# Patient Record
Sex: Male | Born: 1980
Health system: Southern US, Community
[De-identification: ages and names within clinical notes are randomized; demographics above are authoritative.]

## PROBLEM LIST (undated history)

## (undated) DIAGNOSIS — F419 Anxiety disorder, unspecified: Secondary | ICD-10-CM

## (undated) DIAGNOSIS — L309 Dermatitis, unspecified: Secondary | ICD-10-CM

## (undated) DIAGNOSIS — J302 Other seasonal allergic rhinitis: Secondary | ICD-10-CM

## (undated) DIAGNOSIS — J45909 Unspecified asthma, uncomplicated: Secondary | ICD-10-CM

## (undated) DIAGNOSIS — J189 Pneumonia, unspecified organism: Secondary | ICD-10-CM

## (undated) HISTORY — PX: WISDOM TOOTH EXTRACTION: SHX21

## (undated) HISTORY — PX: VASECTOMY: SHX75

---

## 2004-03-13 DIAGNOSIS — J189 Pneumonia, unspecified organism: Secondary | ICD-10-CM

## 2004-03-13 HISTORY — DX: Pneumonia, unspecified organism: J18.9

## 2012-03-03 ENCOUNTER — Ambulatory Visit: Payer: Self-pay | Admitting: Emergency Medicine

## 2012-03-03 DIAGNOSIS — Z01810 Encounter for preprocedural cardiovascular examination: Secondary | ICD-10-CM

## 2012-03-03 LAB — RAPID INFLUENZA A&B ANTIGENS

## 2015-11-17 ENCOUNTER — Ambulatory Visit
Admission: EM | Admit: 2015-11-17 | Discharge: 2015-11-17 | Disposition: A | Discharge status: Home / Self Care | Payer: BLUE CROSS/BLUE SHIELD | Attending: Family Medicine | Admitting: Family Medicine

## 2015-11-17 DIAGNOSIS — R0602 Shortness of breath: Secondary | ICD-10-CM | POA: Diagnosis not present

## 2015-11-17 DIAGNOSIS — R0789 Other chest pain: Secondary | ICD-10-CM | POA: Diagnosis not present

## 2015-11-17 HISTORY — DX: Unspecified asthma, uncomplicated: J45.909

## 2015-11-17 MED ORDER — ASPIRIN 81 MG PO CHEW
324.0000 mg | CHEWABLE_TABLET | Freq: Once | ORAL | Status: AC
  Administered 2015-11-17: 324 mg via ORAL

## 2015-11-17 NOTE — ED Triage Notes (Addendum)
Patient works full time and is also a Consulting civil engineerstudent. He says last night in class her starting getting dizzy, he couldn't catch his breath, and his heart was racing. The nursing students took his blood pressure and it was elevated for him 155/85. No history of panic attacks. Patient does have a lot of family history of cardiac deaths at a very young age. He states he is having chest pressure.

## 2015-11-17 NOTE — ED Provider Notes (Signed)
[facility]    CSN: [id number] Arrival date & time: 11/17/15  1104  First Provider Contact:  First MD Initiated Contact with Patient 11/17/15 1139        History   Chief Complaint Chief Complaint  Patient presents with  . Panic Attack    HPI Sultan Pargas is a 35 y.o. male.   Patient is a 35 year old white male who comes in with shortness of breath. He states that last night while at this class he started developing shortness of breath difficulty breathing. The extended history he informs me that basically his blood pressure was elevated for him in the 150 range and diastolic was above 90. He reports the child several different things see if he had hypoglycemia medicine help. He did better when he was walking and standing up. He still has shortness of breath the night he also has some chest tightness on the left side of his chest and of feeling of pain deep in the mellitus chest. He had blood work done recently in the last 6 months and had normal cholesterol normal blood sugars does not have a history of diabetes but there is a strong family history of coronary artery disease in several relatives first and second-degree like his father and grandfather and another family member that died between the late 71s and mid 86s. He's ever had a stress test or cardiac evaluation no history of anxiety attack or anxiety spells either. She will be noted the patient does not exercise he states he hasn't had   The history is provided by the patient and the spouse. No language interpreter was used.  Chest Pain  Pain location:  Substernal area Pain quality: sharp and stabbing   Pain quality: no pressure, not radiating and not tearing   Pain radiates to:  Does not radiate Onset quality:  Sudden Duration:  1 day Timing:  Constant Chronicity:  New Context: breathing, movement and at rest   Context: not intercourse, not lifting, not raising an arm, not stress and not trauma   Relieved  by:  Rest and certain positions Worsened by:  Exertion and deep breathing Ineffective treatments:  None tried Associated symptoms: no anorexia, no cough, no diaphoresis, no fever, no palpitations, no PND and no shortness of breath   Risk factors: male sex   Risk factors: no birth control, no coronary artery disease, no high cholesterol, no hypertension, no immobilization, not obese, not pregnant, no prior DVT/PE, no smoking and no surgery     Past Medical History:  Diagnosis Date  . Asthma    Childhood    There are no active problems to display for this patient.   History reviewed. No pertinent surgical history.     Home Medications    Prior to Admission medications   Medication Sig Start Date End Date Taking? Authorizing Provider  Multiple Vitamin (MULTIVITAMIN) tablet Take 1 tablet by mouth daily.   Yes Historical Provider, MD    Family History History reviewed. No pertinent family history.  Social History Social History  Substance Use Topics  . Smoking status: Never Smoker  . Smokeless tobacco: Never Used  . Alcohol use No     Allergies   Review of patient's allergies indicates no known allergies.   Review of Systems Review of Systems  Constitutional: Negative for diaphoresis and fever.  Respiratory: Negative for cough and shortness of breath.   Cardiovascular: Positive for chest pain. Negative for palpitations and PND.  Gastrointestinal: Negative for anorexia.  All other systems reviewed and are negative.    Physical Exam Triage Vital Signs ED Triage Vitals  Enc Vitals Group     BP 11/17/15 1127 134/76     Pulse Rate 11/17/15 1127 74     Resp 11/17/15 1127 18     Temp 11/17/15 1127 98 F (36.7 C)     Temp Source 11/17/15 1127 Oral     SpO2 11/17/15 1127 97 %     Weight 11/17/15 1124 212 lb (96.2 kg)     Height 11/17/15 1124 5\' 11"  (1.803 m)     Head Circumference --      Peak Flow --      Pain Score 11/17/15 1126 7     Pain Loc --      Pain  Edu? --      Excl. in GC? --    No data found.   Updated Vital Signs BP 134/76 (BP Location: Right Arm)   Pulse 74   Temp 98 F (36.7 C) (Oral)   Resp 18   Ht 5\' 11"  (1.803 m)   Wt 212 lb (96.2 kg)   SpO2 97%   BMI 29.57 kg/m   Visual Acuity Right Eye Distance:   Left Eye Distance:   Bilateral Distance:    Right Eye Near:   Left Eye Near:    Bilateral Near:     Physical Exam  Constitutional: He is oriented to person, place, and time. He appears well-developed.  HENT:  Head: Normocephalic and atraumatic.  Eyes: Pupils are equal, round, and reactive to light.  Neck: Neck supple.  Cardiovascular: Normal rate, regular rhythm and normal heart sounds.   Pulmonary/Chest: Effort normal and breath sounds normal.  Abdominal: Soft.  Musculoskeletal: Normal range of motion. He exhibits no edema or tenderness.  Neurological: He is alert and oriented to person, place, and time.  Skin: Skin is warm and dry.  Psychiatric: His behavior is normal. His mood appears anxious.  Vitals reviewed.    UC Treatments / Results  Labs (all labs ordered are listed, but only abnormal results are displayed) Labs Reviewed - No data to display  EKG  EKG Interpretation None      ED ECG REPORT I, Chasten Blaze H, the attending physician, personally viewed and interpreted this ECG.   Date: 11/17/2015  EKG Time:12:10:36  Rate: 64  Rhythm: normal EKG, normal sinus rhythm, there are no previous tracings available for comparison  Axis: 25  Intervals:none  ST&T Change: none  Radiology No results found.  Procedures Procedures (including critical care time)  Medications Ordered in UC Medications  aspirin chewable tablet 324 mg (324 mg Oral Given 11/17/15 1204)     Initial Impression / Assessment and Plan / UC Course  I have reviewed the triage vital signs and the nursing notes.  Pertinent labs & imaging results that were available during my care of the patient were reviewed by me and  considered in my medical decision making (see chart for details).  Clinical Course   Should be noted patient does not have any chest wall tenderness to palpation. Explained to him and his wife though he does not having trouble with his cholesterol diabetes or hypertension normally his blood pressure was elevated last night the pain is persistent in fact get worse in that he's having a sharp stabbing pain. Explained to them I'm concerned about possible PE versus coronary artery disease he does have (family history of sudden death. He does not have  a history of anxiety and though while this could be anxiety do not feel that is proper for me at urgent care to try to diagnose him for the first time having anxiety attack. We will give him 4 baby aspirins sent to the ED of his choice which is [facility]. EKG was normal.  Final Clinical Impressions(s) / UC Diagnoses   Final diagnoses:  Tightness in chest  Shortness of breath    New Prescriptions Discharge Medication List as of 11/17/2015 12:07 PM       Hassan Rowan, MD 11/17/15 1218

## 2016-04-16 ENCOUNTER — Ambulatory Visit
Admission: EM | Admit: 2016-04-16 | Discharge: 2016-04-16 | Disposition: A | Discharge status: Home / Self Care | Payer: BLUE CROSS/BLUE SHIELD | Attending: Emergency Medicine | Admitting: Emergency Medicine

## 2016-04-16 ENCOUNTER — Ambulatory Visit (INDEPENDENT_AMBULATORY_CARE_PROVIDER_SITE_OTHER): Payer: BLUE CROSS/BLUE SHIELD

## 2016-04-16 ENCOUNTER — Encounter: Payer: Self-pay | Admitting: Gynecology

## 2016-04-16 DIAGNOSIS — J069 Acute upper respiratory infection, unspecified: Secondary | ICD-10-CM

## 2016-04-16 HISTORY — DX: Anxiety disorder, unspecified: F41.9

## 2016-04-16 HISTORY — DX: Dermatitis, unspecified: L30.9

## 2016-04-16 HISTORY — DX: Pneumonia, unspecified organism: J18.9

## 2016-04-16 MED ORDER — HALCINONIDE 0.1 % EX CREA: 1.0000 "application " | TOPICAL_CREAM | Freq: Two times a day (BID) | CUTANEOUS | 0 refills | Status: DC

## 2016-04-16 MED ORDER — HYDROCOD POLST-CPM POLST ER 10-8 MG/5ML PO SUER: 5.0000 mL | Freq: Two times a day (BID) | ORAL | 0 refills | Status: DC

## 2016-04-16 MED ORDER — PREDNISONE 20 MG PO TABS: ORAL_TABLET | ORAL | 0 refills | Status: DC

## 2016-04-16 MED ORDER — AZITHROMYCIN 250 MG PO TABS: 250.0000 mg | ORAL_TABLET | Freq: Every day | ORAL | 0 refills | Status: DC

## 2016-04-16 MED ORDER — BENZONATATE 200 MG PO CAPS: ORAL_CAPSULE | ORAL | 0 refills | Status: DC

## 2016-04-16 MED ORDER — ALBUTEROL SULFATE HFA 108 (90 BASE) MCG/ACT IN AERS: 1.0000 | INHALATION_SPRAY | Freq: Four times a day (QID) | RESPIRATORY_TRACT | 0 refills | Status: DC | PRN

## 2016-04-16 NOTE — ED Provider Notes (Signed)
CSN: [id number]     Arrival date & time 04/16/16  0824 History   First MD Initiated Contact with Patient 04/16/16 [phone]     Chief Complaint  Patient presents with  . Cough  . Headache   (Consider location/radiation/quality/duration/timing/severity/associated sxs/prior Treatment) HPI  This a 36 year old male who presents with cough chest congestion and headache that began approximately 5 days ago. Fever to 101 although is 98.6 today. He had a very severe headache yesterday that was pounding especially when he coughed but it has lessened somewhat today. Is complaining of chills. Main problem is incessant coughing. It is been essentially nonproductive until today when he started having some green sputum. He has a previous history of pneumonia on 2 occasions. One requiring hospitalization in 1998 the second one treated as outpatient in 2006.      Past Medical History:  Diagnosis Date  . Anxiety   . Asthma    Childhood  . Eczema    bilateral legs  . Pneumonia 2006   Past Surgical History:  Procedure Laterality Date  . VASECTOMY    . WISDOM TOOTH EXTRACTION     No family history on file. Social History  Substance Use Topics  . Smoking status: Never Smoker  . Smokeless tobacco: Never Used  . Alcohol use Yes    Review of Systems  Constitutional: Positive for activity change, chills, fatigue and fever.  HENT: Positive for congestion.   Respiratory: Positive for cough, shortness of breath and wheezing. Negative for stridor.   All other systems reviewed and are negative.   Allergies  Patient has no known allergies.  Home Medications   Prior to Admission medications   Medication Sig Start Date End Date Taking? Authorizing Provider  Multiple Vitamin (MULTIVITAMIN) tablet Take 1 tablet by mouth daily.   Yes Historical Provider, MD  albuterol (PROVENTIL HFA;VENTOLIN HFA) 108 (90 Base) MCG/ACT inhaler Inhale 1-2 puffs into the lungs every 6 (six) hours as needed for wheezing or  shortness of breath. Use with spacer 04/16/16   Lutricia Feil, PA-C  azithromycin (ZITHROMAX) 250 MG tablet Take 1 tablet (250 mg total) by mouth daily. Take first 2 tablets together, then 1 every day until finished. 04/16/16   Lutricia Feil, PA-C  benzonatate (TESSALON) 200 MG capsule Take one cap TID PRN cough 04/16/16   Lutricia Feil, PA-C  chlorpheniramine-HYDROcodone [facility] ER) 10-8 MG/5ML SUER Take 5 mLs by mouth 2 (two) times daily. 04/16/16   Lutricia Feil, PA-C  Halcinonide 0.1 % CREA Apply 1 application topically 2 (two) times daily. 04/16/16   Lutricia Feil, PA-C  predniSONE (DELTASONE) 20 MG tablet Take 2 tablets (40 mg) daily by mouth 04/16/16   Lutricia Feil, PA-C   Meds Ordered and Administered this Visit  Medications - No data to display  BP 123/78 (BP Location: Left Arm)   Pulse 77   Temp 98.6 F (37 C) (Oral)   Resp 18   Ht 5\' 11"  (1.803 m)   Wt 204 lb (92.5 kg)   SpO2 100%   BMI 28.45 kg/m  No data found.   Physical Exam  Constitutional: He is oriented to person, place, and time. He appears well-developed and well-nourished. No distress.  HENT:  Head: Normocephalic and atraumatic.  Right Ear: External ear normal.  Left Ear: External ear normal.  Nose: Nose normal.  Mouth/Throat: Oropharynx is clear and moist. No oropharyngeal exudate.  Eyes: EOM are normal. Pupils are equal, round, and reactive  to light. Right eye exhibits no discharge. Left eye exhibits no discharge.  Neck: Normal range of motion. Neck supple.  Pulmonary/Chest:  Patient coughs with a deep inspiration almost every breath. There is expiratory wheezes throughout. Patient does have non-tussive crackles in the right base.  Musculoskeletal: Normal range of motion.  Lymphadenopathy:    He has no cervical adenopathy.  Neurological: He is alert and oriented to person, place, and time.  Skin: Skin is warm and dry. He is not diaphoretic.  Psychiatric: He has a normal mood and  affect. His behavior is normal. Judgment and thought content normal.  Nursing note and vitals reviewed.   Urgent Care Course     Procedures (including critical care time)  Labs Review Labs Reviewed - No data to display  Imaging Review Dg Chest 2 View  Result Date: 04/16/2016 CLINICAL DATA:  Cough for 4 days EXAM: CHEST  2 VIEW COMPARISON:  March 03, 2012 FINDINGS: The heart size and mediastinal contours are within normal limits. Both lungs are clear. The visualized skeletal structures are unremarkable. IMPRESSION: No active cardiopulmonary disease. Electronically Signed   By: Gerome Samavid  Williams III M.D   On: 04/16/2016 09:27     Visual Acuity Review  Right Eye Distance:   Left Eye Distance:   Bilateral Distance:    Right Eye Near:   Left Eye Near:    Bilateral Near:         MDM   1. Upper respiratory tract infection, unspecified type    New Prescriptions   ALBUTEROL (PROVENTIL HFA;VENTOLIN HFA) 108 (90 BASE) MCG/ACT INHALER    Inhale 1-2 puffs into the lungs every 6 (six) hours as needed for wheezing or shortness of breath. Use with spacer   AZITHROMYCIN (ZITHROMAX) 250 MG TABLET    Take 1 tablet (250 mg total) by mouth daily. Take first 2 tablets together, then 1 every day until finished.   BENZONATATE (TESSALON) 200 MG CAPSULE    Take one cap TID PRN cough   CHLORPHENIRAMINE-HYDROCODONE (TUSSIONEX PENNKINETIC ER) 10-8 MG/5ML SUER    Take 5 mLs by mouth 2 (two) times daily.   HALCINONIDE 0.1 % CREA    Apply 1 application topically 2 (two) times daily.   PREDNISONE (DELTASONE) 20 MG TABLET    Take 2 tablets (40 mg) daily by mouth  Plan: 1. Test/x-ray results and diagnosis reviewed with patient 2. rx as per orders; risks, benefits, potential side effects reviewed with patient 3. Recommend supportive treatment with Rest and fluids. Use Tylenol or Motrin for fever or body aches. I did prescribe azithromycin for use if he does not improve in a week. Did tell him that this is  likely viral . He will use the albuterol as necessary for wheezing. He is not improving he should follow-up with her primary care physician At his request I refilled his halog for his eczema told him that he will need to contact his dermatologist for further refills   Lutricia FeilWilliam P Tangy Drozdowski, PA-C 04/16/16 [id number]    Lutricia FeilWilliam P Emileigh Kellett, PA-C 04/16/16 [phone]

## 2016-04-16 NOTE — ED Triage Notes (Signed)
Patient c/o cough / chest congestion and headache.

## 2016-12-30 ENCOUNTER — Ambulatory Visit
Admission: EM | Admit: 2016-12-30 | Discharge: 2016-12-30 | Disposition: A | Discharge status: Home / Self Care | Payer: BLUE CROSS/BLUE SHIELD | Attending: Family | Admitting: Family

## 2016-12-30 ENCOUNTER — Encounter: Payer: Self-pay | Admitting: Gynecology

## 2016-12-30 ENCOUNTER — Telehealth: Payer: Self-pay | Admitting: *Deleted

## 2016-12-30 DIAGNOSIS — R05 Cough: Secondary | ICD-10-CM | POA: Diagnosis not present

## 2016-12-30 DIAGNOSIS — J069 Acute upper respiratory infection, unspecified: Secondary | ICD-10-CM | POA: Diagnosis not present

## 2016-12-30 DIAGNOSIS — J209 Acute bronchitis, unspecified: Secondary | ICD-10-CM

## 2016-12-30 MED ORDER — BENZONATATE 100 MG PO CAPS: 100.0000 mg | ORAL_CAPSULE | Freq: Two times a day (BID) | ORAL | 0 refills | Status: DC

## 2016-12-30 MED ORDER — ALBUTEROL SULFATE HFA 108 (90 BASE) MCG/ACT IN AERS: 1.0000 | INHALATION_SPRAY | Freq: Four times a day (QID) | RESPIRATORY_TRACT | 1 refills | Status: DC | PRN

## 2016-12-30 MED ORDER — ALBUTEROL SULFATE (2.5 MG/3ML) 0.083% IN NEBU
2.5000 mg | INHALATION_SOLUTION | Freq: Once | RESPIRATORY_TRACT | Status: AC
  Administered 2016-12-30: 2.5 mg via RESPIRATORY_TRACT

## 2016-12-30 MED ORDER — AZITHROMYCIN 250 MG PO TABS: ORAL_TABLET | ORAL | 0 refills | Status: DC

## 2016-12-30 NOTE — ED Provider Notes (Signed)
[facility]    CSN: [id number] Arrival date & time: 12/30/16  1001     History   Chief Complaint Chief Complaint  Patient presents with  . Cough    HPI Mark Richards is a 36 y.o. male.   CC: productive cough, nasal drainage, worsening x 8 days.  Tried tyelonol cold. Endorses nausea today.Feels like cannot get mucous out.  Feels SOB, sinus pressure. No wheezing, cp, leg swelling.  NO ear pain, sore throat, fever, chills. No recent travel, h/o dvt. Also endorses HA , improved after tyleonol today. 'not worse headache of life.'   No h/o HAs. Thinks HA is related to coughing, pressure from sinuses.   No lung history, non smoker.  Has used inhaler in the past. H/o PNA.           Past Medical History:  Diagnosis Date  . Anxiety   . Asthma    Childhood  . Eczema    bilateral legs  . Pneumonia 2006    There are no active problems to display for this patient.   Past Surgical History:  Procedure Laterality Date  . VASECTOMY    . WISDOM TOOTH EXTRACTION         Home Medications    Prior to Admission medications   Medication Sig Start Date End Date Taking? Authorizing Provider  Multiple Vitamin (MULTIVITAMIN) tablet Take 1 tablet by mouth daily.   Yes [provider]  albuterol (PROVENTIL HFA;VENTOLIN HFA) 108 (90 Base) MCG/ACT inhaler Inhale 1-2 puffs into the lungs every 6 (six) hours as needed for wheezing or shortness of breath. Use with spacer 04/16/16   Lutricia Feil, PA-C  azithromycin (ZITHROMAX) 250 MG tablet Take 1 tablet (250 mg total) by mouth daily. Take first 2 tablets together, then 1 every day until finished. 04/16/16   Lutricia Feil, PA-C  benzonatate (TESSALON) 200 MG capsule Take one cap TID PRN cough 04/16/16   Lutricia Feil, PA-C  chlorpheniramine-HYDROcodone [place] ER) 10-8 MG/5ML SUER Take 5 mLs by mouth 2 (two) times daily. 04/16/16   Lutricia Feil, PA-C  Halcinonide 0.1 % CREA Apply 1  application topically 2 (two) times daily. 04/16/16   Lutricia Feil, PA-C  predniSONE (DELTASONE) 20 MG tablet Take 2 tablets (40 mg) daily by mouth 04/16/16   Lutricia Feil, PA-C    Family History No family history on file.  Social History Social History  Substance Use Topics  . Smoking status: Never Smoker  . Smokeless tobacco: Never Used  . Alcohol use Yes     Allergies   Patient has no known allergies.   Review of Systems Review of Systems  Constitutional: Negative for chills and fever.  HENT: Positive for congestion and sinus pressure. Negative for ear pain and sore throat.   Eyes: Negative for discharge and visual disturbance.  Respiratory: Positive for cough, shortness of breath and wheezing.   Cardiovascular: Negative for chest pain and palpitations.  Gastrointestinal: Negative for nausea and vomiting.  Neurological: Positive for headaches. Negative for dizziness.     Physical Exam Triage Vital Signs ED Triage Vitals  Enc Vitals Group     BP 12/30/16 1012 128/89     Pulse Rate 12/30/16 1012 70     Resp 12/30/16 1012 16     Temp 12/30/16 1012 98.4 F (36.9 C)     Temp Source 12/30/16 1012 Oral     SpO2 12/30/16 1012 99 %  Weight 12/30/16 1013 212 lb (96.2 kg)     Height 12/30/16 1013 5\' 11"  (1.803 m)     Head Circumference --      Peak Flow --      Pain Score 12/30/16 1013 4     Pain Loc --      Pain Edu? --      Excl. in GC? --    No data found.   Updated Vital Signs BP 128/89 (BP Location: Left Arm)   Pulse 70   Temp 98.4 F (36.9 C) (Oral)   Resp 16   Ht 5\' 11"  (1.803 m)   Wt 212 lb (96.2 kg)   SpO2 99%   BMI 29.57 kg/m   Visual Acuity Right Eye Distance:   Left Eye Distance:   Bilateral Distance:    Right Eye Near:   Left Eye Near:    Bilateral Near:     Physical Exam  Constitutional: Vital signs are normal. He appears well-developed and well-nourished.  HENT:  Head: Normocephalic and atraumatic.  Right Ear: Hearing,  tympanic membrane, external ear and ear canal normal. No drainage, swelling or tenderness. Tympanic membrane is not injected, not erythematous and not bulging. No middle ear effusion. No decreased hearing is noted.  Left Ear: Hearing, tympanic membrane, external ear and ear canal normal. No drainage, swelling or tenderness. Tympanic membrane is not injected, not erythematous and not bulging.  No middle ear effusion. No decreased hearing is noted.  Nose: Nose normal. Right sinus exhibits no maxillary sinus tenderness and no frontal sinus tenderness. Left sinus exhibits no maxillary sinus tenderness and no frontal sinus tenderness.  Mouth/Throat: Uvula is midline, oropharynx is clear and moist and mucous membranes are normal. No oropharyngeal exudate, posterior oropharyngeal edema, posterior oropharyngeal erythema or tonsillar abscesses.  Eyes: Conjunctivae are normal.  Cardiovascular: Regular rhythm and normal heart sounds.   Pulmonary/Chest: Effort normal. No respiratory distress. He has wheezes in the left middle field. He has no rhonchi. He has no rales.  Lymphadenopathy:       Head (right side): No submental, no submandibular, no tonsillar, no preauricular, no posterior auricular and no occipital adenopathy present.       Head (left side): No submental, no submandibular, no tonsillar, no preauricular, no posterior auricular and no occipital adenopathy present.    He has no cervical adenopathy.  Neurological: He is alert.  Skin: Skin is warm and dry.  Psychiatric: He has a normal mood and affect. His speech is normal and behavior is normal.  Vitals reviewed.  Patient felt significantly better after albuterol treatment. Lung sounds clear and increased. Even states HA improved after treatment.   UC Treatments / Results  Labs (all labs ordered are listed, but only abnormal results are displayed) Labs Reviewed - No data to display  EKG  EKG Interpretation None       Radiology No results  found.  Procedures Procedures (including critical care time)  Medications Ordered in UC Medications  albuterol (PROVENTIL) (2.5 MG/3ML) 0.083% nebulizer solution 2.5 mg (not administered)     Initial Impression / Assessment and Plan / UC Course  I have reviewed the triage vital signs and the nursing notes.  Pertinent labs & imaging results that were available during my care of the patient were reviewed by me and considered in my medical decision making (see chart for details).       Final Clinical Impressions(s) / UC Diagnoses   Final diagnoses:  Acute bronchitis, unspecified organism  Patient is afebrile, SaO2 99%. He is not in any acute respiratory distress. He improved after nebulizer treatment. Based on duration and worsening features, we'll go ahead and advised inhaler, antibiotics, Cough medication as needed. Return precautions given.  New Prescriptions New Prescriptions   No medications on file     Controlled Substance Prescriptions [place] Controlled Substance Registry consulted? Not Applicable   Allegra Granarnett, Rahi Chandonnet G, FNP 12/30/16 1143

## 2016-12-30 NOTE — Discharge Instructions (Signed)
Use albuterol every 6 hours for first 24 hours to get good medication into the lungs and loosen congestion; after, you may use as needed and eventually stop all together when cough resolves.  Mucinex   Plenty of fluids  Have a good trip next week!  If there is no improvement in your symptoms, or if there is any worsening of symptoms, or if you have any additional concerns, please return for re-evaluation; or, if we are closed, consider going to the Emergency Room for evaluation if symptoms urgent.

## 2016-12-30 NOTE — ED Triage Notes (Signed)
Patient c/o nasal drainage / congestion/ headache and cough.

## 2018-09-02 DIAGNOSIS — S70351A Superficial foreign body, right thigh, initial encounter: Secondary | ICD-10-CM | POA: Diagnosis not present

## 2018-09-02 DIAGNOSIS — R030 Elevated blood-pressure reading, without diagnosis of hypertension: Secondary | ICD-10-CM | POA: Diagnosis not present

## 2018-09-02 DIAGNOSIS — L089 Local infection of the skin and subcutaneous tissue, unspecified: Secondary | ICD-10-CM | POA: Diagnosis not present

## 2018-09-02 DIAGNOSIS — W57XXXA Bitten or stung by nonvenomous insect and other nonvenomous arthropods, initial encounter: Secondary | ICD-10-CM | POA: Diagnosis not present

## 2018-09-22 DIAGNOSIS — R110 Nausea: Secondary | ICD-10-CM | POA: Diagnosis not present

## 2018-09-22 DIAGNOSIS — R1032 Left lower quadrant pain: Secondary | ICD-10-CM | POA: Diagnosis not present

## 2018-09-22 DIAGNOSIS — N202 Calculus of kidney with calculus of ureter: Secondary | ICD-10-CM | POA: Diagnosis not present

## 2018-09-22 DIAGNOSIS — N132 Hydronephrosis with renal and ureteral calculous obstruction: Secondary | ICD-10-CM | POA: Diagnosis not present

## 2018-09-22 DIAGNOSIS — N200 Calculus of kidney: Secondary | ICD-10-CM | POA: Diagnosis not present

## 2018-09-23 DIAGNOSIS — N132 Hydronephrosis with renal and ureteral calculous obstruction: Secondary | ICD-10-CM | POA: Diagnosis not present

## 2018-09-23 DIAGNOSIS — N202 Calculus of kidney with calculus of ureter: Secondary | ICD-10-CM | POA: Diagnosis not present

## 2019-03-21 DIAGNOSIS — Z20828 Contact with and (suspected) exposure to other viral communicable diseases: Secondary | ICD-10-CM | POA: Diagnosis not present

## 2020-02-22 ENCOUNTER — Ambulatory Visit
Admission: EM | Admit: 2020-02-22 | Discharge: 2020-02-22 | Disposition: A | Discharge status: Home / Self Care | Payer: BC Managed Care – PPO

## 2020-02-22 ENCOUNTER — Encounter: Payer: Self-pay | Admitting: Emergency Medicine

## 2020-02-22 ENCOUNTER — Other Ambulatory Visit: Payer: Self-pay

## 2020-02-22 DIAGNOSIS — L0291 Cutaneous abscess, unspecified: Secondary | ICD-10-CM | POA: Diagnosis not present

## 2020-02-22 HISTORY — DX: Other seasonal allergic rhinitis: J30.2

## 2020-02-22 MED ORDER — DOXYCYCLINE HYCLATE 100 MG PO CAPS: 100.0000 mg | ORAL_CAPSULE | Freq: Two times a day (BID) | ORAL | 0 refills | Status: DC

## 2020-02-22 NOTE — ED Triage Notes (Signed)
Patient in today c/o an abscess on his lower abdomen x 3-4 days, getting worse yesterday. Patient denies fever. Patient has cleaned with soap and water and wiped with alcohol last night.

## 2020-02-22 NOTE — ED Provider Notes (Signed)
[facility]    CSN: [id number] Arrival date & time: 02/22/20  0809      History   Chief Complaint Chief Complaint  Patient presents with  . Cellulitis    HPI Mark Richards is a 39 y.o. male.   Mark Richards presents with complaints of abscess to right lower abdomen which he noted a few days ago. Became painful yesterday. States he has not visualized any "head" and has not had any drainage from the area. It is tender to touch. He tried introducing a needle to the area and no production of purulence or drainage. He does shave hair at the affected area. No fevers. No previous similar.     ROS per HPI, negative if not otherwise mentioned.      Past Medical History:  Diagnosis Date  . Anxiety   . Asthma    Childhood  . Eczema    bilateral legs  . Pneumonia 2006  . Seasonal allergies     There are no problems to display for this patient.   Past Surgical History:  Procedure Laterality Date  . VASECTOMY    . WISDOM TOOTH EXTRACTION         Home Medications    Prior to Admission medications   Medication Sig Start Date End Date Taking? Authorizing Provider  fexofenadine-pseudoephedrine (ALLEGRA-D 24) 180-240 MG 24 hr tablet Take 1 tablet by mouth daily as needed.   Yes [provider]  Multiple Vitamin (MULTIVITAMIN) tablet Take 1 tablet by mouth daily.   Yes [provider]  albuterol (PROVENTIL HFA;VENTOLIN HFA) 108 (90 Base) MCG/ACT inhaler Inhale 1-2 puffs into the lungs every 6 (six) hours as needed for wheezing or shortness of breath. 12/30/16   Allegra Grana, FNP  azithromycin (ZITHROMAX) 250 MG tablet Tale 500 mg PO on day 1, then 250 mg PO q24h x 4 days. 12/30/16   Allegra Grana, FNP  benzonatate (TESSALON PERLES) 100 MG capsule Take 1 capsule (100 mg total) by mouth 2 (two) times daily. 12/30/16   Allegra Grana, FNP  doxycycline (VIBRAMYCIN) 100 MG capsule Take 1 capsule (100 mg total) by mouth 2 (two)  times daily. 02/22/20   Linus Mako B, NP  Halcinonide 0.1 % CREA Apply 1 application topically 2 (two) times daily. 04/16/16   Lutricia Feil, PA-C    Family History Family History  Problem Relation Age of Onset  . Healthy Mother   . Heart attack Father 66    Social History Social History   Tobacco Use  . Smoking status: Never Smoker  . Smokeless tobacco: Never Used  Vaping Use  . Vaping Use: Never used  Substance Use Topics  . Alcohol use: Yes    Comment: social  . Drug use: No     Allergies   Patient has no known allergies.   Review of Systems Review of Systems   Physical Exam Triage Vital Signs ED Triage Vitals  Enc Vitals Group     BP 02/22/20 0818 128/90     Pulse Rate 02/22/20 0818 76     Resp 02/22/20 0818 18     Temp 02/22/20 0818 99.3 F (37.4 C)     Temp Source 02/22/20 0818 Oral     SpO2 02/22/20 0818 100 %     Weight 02/22/20 0819 215 lb (97.5 kg)     Height 02/22/20 0819 5\' 11"  (1.803 m)     Head Circumference --      Peak  Flow --      Pain Score 02/22/20 0818 4     Pain Loc --      Pain Edu? --      Excl. in GC? --    No data found.  Updated Vital Signs BP 128/90 (BP Location: Left Arm)   Pulse 76   Temp 99.3 F (37.4 C) (Oral)   Resp 18   Ht 5\' 11"  (1.803 m)   Wt 215 lb (97.5 kg)   SpO2 100%   BMI 29.99 kg/m   Visual Acuity Right Eye Distance:   Left Eye Distance:   Bilateral Distance:    Right Eye Near:   Left Eye Near:    Bilateral Near:     Physical Exam Constitutional:      Appearance: He is well-developed.  Cardiovascular:     Rate and Rhythm: Normal rate.  Pulmonary:     Effort: Pulmonary effort is normal.  Skin:    General: Skin is warm and dry.          Comments: Right pubis with redness, tenderness and minimally fluctuant abscess; affected region of redness approximately 1.5 cm in width; no pustule visible   Neurological:     Mental Status: He is alert and oriented to person, place, and time.       UC Treatments / Results  Labs (all labs ordered are listed, but only abnormal results are displayed) Labs Reviewed - No data to display  EKG   Radiology No results found.  Procedures Procedures (including critical care time)  Medications Ordered in UC Medications - No data to display  Initial Impression / Assessment and Plan / UC Course  I have reviewed the triage vital signs and the nursing notes.  Pertinent labs & imaging results that were available during my care of the patient were reviewed by me and considered in my medical decision making (see chart for details).     Likely folliculitis/ ingrown hair. Very Small amount of deep fluctuance, opted to initiate antibiotics and I&D deferred today due to surrounding redness and tenderness. Return later this week if no improvement or worsening as may need incised at that point. Patient verbalized understanding and agreeable to plan.    Final Clinical Impressions(s) / UC Diagnoses   Final diagnoses:  Abscess     Discharge Instructions     Apply warm compresses to the affected area.  Complete course of antibiotics.  Return mid next week if this is not improving or if worsening as it may need to be incised and drained.     ED Prescriptions    Medication Sig Dispense Auth. Provider   doxycycline (VIBRAMYCIN) 100 MG capsule Take 1 capsule (100 mg total) by mouth 2 (two) times daily. 20 capsule , NP     PDMP not reviewed this encounter.   Georgetta Haber, NP 02/22/20 [phone]

## 2020-02-22 NOTE — Discharge Instructions (Signed)
Apply warm compresses to the affected area.  Complete course of antibiotics.  Return mid next week if this is not improving or if worsening as it may need to be incised and drained.

## 2020-07-19 ENCOUNTER — Ambulatory Visit: Payer: BC Managed Care – PPO | Admitting: Family Medicine

## 2020-07-28 ENCOUNTER — Ambulatory Visit: Payer: BC Managed Care – PPO | Admitting: Family Medicine

## 2020-07-28 ENCOUNTER — Encounter: Payer: Self-pay | Admitting: Family Medicine

## 2020-07-28 ENCOUNTER — Other Ambulatory Visit: Payer: Self-pay

## 2020-07-28 ENCOUNTER — Ambulatory Visit (INDEPENDENT_AMBULATORY_CARE_PROVIDER_SITE_OTHER): Payer: BC Managed Care – PPO

## 2020-07-28 VITALS — BP 139/86 | HR 67 | Temp 98.0°F | Ht 71.0 in | Wt 227.0 lb

## 2020-07-28 DIAGNOSIS — M25551 Pain in right hip: Secondary | ICD-10-CM | POA: Insufficient documentation

## 2020-07-28 DIAGNOSIS — R1030 Lower abdominal pain, unspecified: Secondary | ICD-10-CM | POA: Diagnosis not present

## 2020-07-28 MED ORDER — MELOXICAM 15 MG PO TABS: 15.0000 mg | ORAL_TABLET | Freq: Every day | ORAL | 0 refills | Status: DC

## 2020-07-28 NOTE — Progress Notes (Signed)
na

## 2020-07-28 NOTE — Progress Notes (Signed)
Mark Richards - 40 y.o. male MRN [id number]  Date of birth: [birth year]  Subjective Chief Complaint  Patient presents with  . Establish Care  . Hip Pain    HPI Mark Richards is a 40 y.o. male here today for initial visit and complaint of R hip pain.  He has had R hip pain for several weeks.  He does not recall any injury.  He has trouble sleeping on that hip.  Pain is located in the groin area.  He does have some radiation into the lateral knee as well.  Pain is usually worse when first sitting.  Pain worsened with crossing leg over other leg.  Denies numbness or tingling.    ROS:  A comprehensive ROS was completed and negative except as noted per HPI  No Known Allergies  Past Medical History:  Diagnosis Date  . Anxiety   . Asthma    Childhood  . Eczema    bilateral legs  . Pneumonia 2006  . Seasonal allergies     Past Surgical History:  Procedure Laterality Date  . VASECTOMY    . WISDOM TOOTH EXTRACTION      Social History   Socioeconomic History  . Marital status: Married    Spouse name: Not on file  . Number of children: Not on file  . Years of education: Not on file  . Highest education level: Not on file  Occupational History  . Not on file  Tobacco Use  . Smoking status: Never Smoker  . Smokeless tobacco: Never Used  Vaping Use  . Vaping Use: Never used  Substance and Sexual Activity  . Alcohol use: Yes    Alcohol/week: 4.0 standard drinks    Types: 4 Cans of beer per week    Comment: social  . Drug use: No  . Sexual activity: Not on file  Other Topics Concern  . Not on file  Social History Narrative  . Not on file   Social Determinants of Health   Financial Resource Strain: Not on file  Food Insecurity: Not on file  Transportation Needs: Not on file  Physical Activity: Not on file  Stress: Not on file  Social Connections: Not on file    Family History  Problem Relation Age of Onset  . Healthy Mother   . Heart attack Father 59     Health Maintenance  Topic Date Due  . COVID-19 Vaccine (1) Never done  . HIV Screening  Never done  . Hepatitis C Screening  Never done  . INFLUENZA VACCINE  10/11/2020  . TETANUS/TDAP  08/26/2022  . HPV VACCINES  Aged Out     ----------------------------------------------------------------------------------------------------------------------------------------------------------------------------------------------------------------- Physical Exam BP 139/86 (BP Location: Left Arm, Patient Position: Sitting, Cuff Size: Large)   Pulse 67   Temp 98 F (36.7 C)   Ht 5\' 11"  (1.803 m)   Wt 227 lb (103 kg)   SpO2 100%   BMI 31.66 kg/m   Physical Exam Constitutional:      Appearance: Normal appearance.  Eyes:     General: No scleral icterus. Musculoskeletal:     Cervical back: Neck supple.     Comments: Pain with internal and external rotation, worse with external rotation..  Pain with faber test.  Negative SLR.    Neurological:     General: No focal deficit present.     Mental Status: He is alert.  Psychiatric:        Mood and Affect: Mood normal.  Behavior: Behavior normal.     ------------------------------------------------------------------------------------------------------------------------------------------------------------------------------------------------------------------- Assessment and Plan  Right hip pain Limited ROM of hip. Xrays of hip ordered  ?hip impingement vs OA.  Labral tear is also a possibility as well.  Will have him try meloxicam 15mg  daily as needed.    Meds ordered this encounter  Medications  . meloxicam (MOBIC) 15 MG tablet    Sig: Take 1 tablet (15 mg total) by mouth daily.    Dispense:  30 tablet    Refill:  0    No follow-ups on file.    This visit occurred during the SARS-CoV-2 public health emergency.  Safety protocols were in place, including screening questions prior to the visit, additional usage of staff PPE,  and extensive cleaning of exam room while observing appropriate contact time as indicated for disinfecting solutions.

## 2020-07-28 NOTE — Assessment & Plan Note (Signed)
Limited ROM of hip. Xrays of hip ordered  ?hip impingement vs OA.  Labral tear is also a possibility as well.  Will have him try meloxicam 15mg  daily as needed.

## 2020-07-28 NOTE — Patient Instructions (Signed)
Great to meet you today! Have xray completed downstairs.  Try meloxicam to help with pain and inflammation.

## 2020-08-03 ENCOUNTER — Telehealth: Payer: Self-pay

## 2020-08-03 NOTE — Telephone Encounter (Signed)
Pt lvm stating he had not received notification of his hip imaging.   Called patient on both numbers listed. Was able to contact him on home phone number. Advised patient that a message had been left with callback information on 07/30/2020. Pt states the phone didn't receive vm's. I advised the patient that I had contacted that phone number and left a 2nd message directly prior to this call. Not sure where the voicemails are being stored but there are messages there.  Switched his phone numbers in the system and labeled them correctly.   Schedule an appointment with Dr. Karie Schwalbe for tomorrow due to increased pain in the hip joint and sleep discomfort.   Dr. Karie Schwalbe has been advised of the schedule addition and report given.

## 2020-08-04 ENCOUNTER — Ambulatory Visit: Payer: BC Managed Care – PPO | Admitting: Sports Medicine

## 2020-08-04 ENCOUNTER — Other Ambulatory Visit: Payer: Self-pay

## 2020-08-04 DIAGNOSIS — M25551 Pain in right hip: Secondary | ICD-10-CM | POA: Diagnosis not present

## 2020-08-04 MED ORDER — TRAMADOL HCL 50 MG PO TABS: 50.0000 mg | ORAL_TABLET | Freq: Three times a day (TID) | ORAL | 0 refills | Status: AC | PRN

## 2020-08-04 NOTE — Assessment & Plan Note (Signed)
This is a pleasant 40 year old male, he has had at least a month of pain in his right hip, localized in the groin after leaning forward and twisting to the side. He does have mechanical symptoms, he has pain with internal rotation, positive FADIR sign, he uses the C sign when describing his pain. Considering the duration of the symptomatology I think this is likely hip labral tear, his x-ray was personally reviewed and is negative, meloxicam has been ineffective, adding tramadol for pain, and proceeding with MR arthrography. I will see him back for the MR arthrogram injection.

## 2020-08-04 NOTE — Progress Notes (Signed)
    Procedures performed today:    None.  Independent interpretation of notes and tests performed by another provider:   Hip x-rays personally reviewed, they are negative.  Brief History, Exam, Impression, and Recommendations:    Right hip pain This is a pleasant 40 year old male, he has had at least a month of pain in his right hip, localized in the groin after leaning forward and twisting to the side. He does have mechanical symptoms, he has pain with internal rotation, positive FADIR sign, he uses the C sign when describing his pain. Considering the duration of the symptomatology I think this is likely hip labral tear, his x-ray was personally reviewed and is negative, meloxicam has been ineffective, adding tramadol for pain, and proceeding with MR arthrography. I will see him back for the MR arthrogram injection.    ___________________________________________ Ihor Austin. Benjamin Stain, M.D., ABFM., CAQSM. Primary Care and Sports Medicine [facility]  Adjunct Instructor of Family Medicine  [employer]

## 2020-08-16 ENCOUNTER — Ambulatory Visit (INDEPENDENT_AMBULATORY_CARE_PROVIDER_SITE_OTHER): Payer: BC Managed Care – PPO | Admitting: Family Medicine

## 2020-08-16 ENCOUNTER — Other Ambulatory Visit: Payer: Self-pay

## 2020-08-16 ENCOUNTER — Ambulatory Visit: Payer: BC Managed Care – PPO | Admitting: Sports Medicine

## 2020-08-16 ENCOUNTER — Encounter: Payer: Self-pay | Admitting: Family Medicine

## 2020-08-16 VITALS — BP 129/78 | HR 67 | Ht 71.0 in | Wt 231.0 lb

## 2020-08-16 DIAGNOSIS — Z13220 Encounter for screening for lipoid disorders: Secondary | ICD-10-CM | POA: Diagnosis not present

## 2020-08-16 DIAGNOSIS — Z0000 Encounter for general adult medical examination without abnormal findings: Secondary | ICD-10-CM | POA: Diagnosis not present

## 2020-08-16 MED ORDER — MELOXICAM 15 MG PO TABS: 15.0000 mg | ORAL_TABLET | Freq: Every day | ORAL | 0 refills | Status: AC

## 2020-08-16 NOTE — Patient Instructions (Signed)

## 2020-08-16 NOTE — Assessment & Plan Note (Signed)
Well adult Orders Placed This Encounter  Procedures  . COMPLETE METABOLIC PANEL WITH GFR  . CBC with Differential  . Lipid Panel w/reflex Direct LDL  Screening: Lipid panel Immunizations; UTD Anticipatory guidance/Risk factor reduction:  Recommendations per AVS. 

## 2020-08-16 NOTE — Progress Notes (Signed)
Mark Richards - 40 y.o. male MRN [id number]  Date of birth: 1980-11-09  Subjective Chief Complaint  Patient presents with  . Annual Exam    HPI Mark Richards is a 40 y.o. male here today for annual exam.  He has been in fairly good health.    He is seeing Dr. Benjamin Stain for hip pain as well. MRI ordered but awaiting insurance approval.   He has not been quite as active due to the hip.  He feels like his diet is pretty good.    He is a non-smoker.  He consumes EtOH on rare occasion.    Review of Systems  Constitutional: Negative for chills, fever, malaise/fatigue and weight loss.  HENT: Negative for congestion, ear pain and sore throat.   Eyes: Negative for blurred vision, double vision and pain.  Respiratory: Negative for cough and shortness of breath.   Cardiovascular: Negative for chest pain and palpitations.  Gastrointestinal: Negative for abdominal pain, blood in stool, constipation, heartburn and nausea.  Genitourinary: Negative for dysuria and urgency.  Musculoskeletal: Negative for joint pain and myalgias.  Neurological: Negative for dizziness and headaches.  Endo/Heme/Allergies: Does not bruise/bleed easily.  Psychiatric/Behavioral: Negative for depression. The patient is not nervous/anxious and does not have insomnia.     No Known Allergies  Past Medical History:  Diagnosis Date  . Anxiety   . Asthma    Childhood  . Eczema    bilateral legs  . Pneumonia 2006  . Seasonal allergies     Past Surgical History:  Procedure Laterality Date  . VASECTOMY    . WISDOM TOOTH EXTRACTION      Social History   Socioeconomic History  . Marital status: Married    Spouse name: Not on file  . Number of children: Not on file  . Years of education: Not on file  . Highest education level: Not on file  Occupational History  . Not on file  Tobacco Use  . Smoking status: Never Smoker  . Smokeless tobacco: Never Used  Vaping Use  . Vaping Use: Never used   Substance and Sexual Activity  . Alcohol use: Yes    Alcohol/week: 4.0 standard drinks    Types: 4 Cans of beer per week    Comment: social  . Drug use: No  . Sexual activity: Not on file  Other Topics Concern  . Not on file  Social History Narrative  . Not on file   Social Determinants of Health   Financial Resource Strain: Not on file  Food Insecurity: Not on file  Transportation Needs: Not on file  Physical Activity: Not on file  Stress: Not on file  Social Connections: Not on file    Family History  Problem Relation Age of Onset  . Healthy Mother   . Heart attack Father 24  . Hypertension Maternal Grandfather   . Hypertension Paternal Grandfather   . Diabetes Paternal Grandfather     Health Maintenance  Topic Date Due  . COVID-19 Vaccine (1) Never done  . HIV Screening  Never done  . Hepatitis C Screening  Never done  . INFLUENZA VACCINE  10/11/2020  . TETANUS/TDAP  08/26/2022  . Zoster Vaccines- Shingrix (1 of 2) 05/10/2029  . Pneumococcal Vaccine 7-73 Years old  Aged Out  . HPV VACCINES  Aged Out     ----------------------------------------------------------------------------------------------------------------------------------------------------------------------------------------------------------------- Physical Exam BP 129/78 (BP Location: Left Arm, Patient Position: Sitting, Cuff Size: Normal)   Pulse 67   Ht 5\' 11"  (1.803  m)   Wt 231 lb (104.8 kg)   SpO2 98%   BMI 32.22 kg/m   Physical Exam Constitutional:      General: He is not in acute distress. HENT:     Head: Normocephalic and atraumatic.     Right Ear: Tympanic membrane and external ear normal.     Left Ear: Tympanic membrane and external ear normal.  Eyes:     General: No scleral icterus. Neck:     Thyroid: No thyromegaly.  Cardiovascular:     Rate and Rhythm: Normal rate and regular rhythm.     Heart sounds: Normal heart sounds.  Pulmonary:     Effort: Pulmonary effort is  normal.     Breath sounds: Normal breath sounds.  Abdominal:     General: Bowel sounds are normal. There is no distension.     Palpations: Abdomen is soft.     Tenderness: There is no abdominal tenderness. There is no guarding.  Musculoskeletal:     Cervical back: Normal range of motion.  Lymphadenopathy:     Cervical: No cervical adenopathy.  Skin:    General: Skin is warm and dry.     Findings: No rash.  Neurological:     Mental Status: He is alert and oriented to person, place, and time.     Cranial Nerves: No cranial nerve deficit.     Motor: No abnormal muscle tone.  Psychiatric:        Behavior: Behavior normal.     ------------------------------------------------------------------------------------------------------------------------------------------------------------------------------------------------------------------- Assessment and Plan  Well adult exam Well adult Orders Placed This Encounter  Procedures  . COMPLETE METABOLIC PANEL WITH GFR  . CBC with Differential  . Lipid Panel w/reflex Direct LDL  Screening: Lipid panel Immunizations; UTD Anticipatory guidance/Risk factor reduction:  Recommendations per AVS.     No orders of the defined types were placed in this encounter.   No follow-ups on file.    This visit occurred during the SARS-CoV-2 public health emergency.  Safety protocols were in place, including screening questions prior to the visit, additional usage of staff PPE, and extensive cleaning of exam room while observing appropriate contact time as indicated for disinfecting solutions.

## 2020-08-17 LAB — COMPLETE METABOLIC PANEL WITH GFR
AG Ratio: 1.4 (calc) (ref 1.0–2.5)
ALT: 28 U/L (ref 9–46)
AST: 21 U/L (ref 10–40)
Albumin: 4.2 g/dL (ref 3.6–5.1)
Alkaline phosphatase (APISO): 53 U/L (ref 36–130)
BUN: 20 mg/dL (ref 7–25)
CO2: 27 mmol/L (ref 20–32)
Calcium: 9.3 mg/dL (ref 8.6–10.3)
Chloride: 103 mmol/L (ref 98–110)
Creat: 0.99 mg/dL (ref 0.60–1.35)
GFR, Est African American: 109 mL/min/{1.73_m2} (ref >=60)
GFR, Est Non African American: 94 mL/min/{1.73_m2} (ref >=60)
Globulin: 3 g/dL (calc) (ref 1.9–3.7)
Glucose, Bld: 96 mg/dL (ref 65–99)
Potassium: 4.3 mmol/L (ref 3.5–5.3)
Sodium: 139 mmol/L (ref 135–146)
Total Bilirubin: 0.6 mg/dL (ref 0.2–1.2)
Total Protein: 7.2 g/dL (ref 6.1–8.1)

## 2020-08-17 LAB — CBC WITH DIFFERENTIAL/PLATELET
Absolute Monocytes: 594 cells/uL (ref 200–950)
Basophils Absolute: 33 cells/uL (ref 0–200)
Basophils Relative: 0.5 %
Eosinophils Absolute: 112 cells/uL (ref 15–500)
Eosinophils Relative: 1.7 %
HCT: 43.2 % (ref 38.5–50.0)
Hemoglobin: 14.4 g/dL (ref 13.2–17.1)
Lymphs Abs: 1610 cells/uL (ref 850–3900)
MCH: 27.6 pg (ref 27.0–33.0)
MCHC: 33.3 g/dL (ref 32.0–36.0)
MCV: 82.8 fL (ref 80.0–100.0)
MPV: 12 fL (ref 7.5–12.5)
Monocytes Relative: 9 %
Neutro Abs: 4250 cells/uL (ref 1500–7800)
Neutrophils Relative %: 64.4 %
Platelets: 210 10*3/uL (ref 140–400)
RBC: 5.22 10*6/uL (ref 4.20–5.80)
RDW: 12.3 % (ref 11.0–15.0)
Total Lymphocyte: 24.4 %
WBC: 6.6 10*3/uL (ref 3.8–10.8)

## 2020-08-17 LAB — LIPID PANEL W/REFLEX DIRECT LDL
Cholesterol: 167 mg/dL (ref <200)
HDL: 47 mg/dL (ref >=40)
LDL Cholesterol (Calc): 99 mg/dL (calc)
Non-HDL Cholesterol (Calc): 120 mg/dL (calc) (ref <130)
Total CHOL/HDL Ratio: 3.6 (calc) (ref <5.0)
Triglycerides: 110 mg/dL (ref <150)

## 2020-08-28 ENCOUNTER — Other Ambulatory Visit: Payer: Self-pay | Admitting: Family Medicine

## 2020-09-02 ENCOUNTER — Ambulatory Visit: Payer: Self-pay | Admitting: Family Medicine

## 2020-11-03 ENCOUNTER — Telehealth: Payer: Self-pay

## 2020-11-03 NOTE — Telephone Encounter (Signed)
Crystal: Please contact this patient or his wife and advise they need to schedule with Dr. Karie Schwalbe for the MRI Arthrogram. This will need to be re-approved thru insurance and scheduled with both Dr. Karie Schwalbe and Imaging for completion.   Pt's spouse, Candace, lvm stating that her husband had not received any results or contact from the office concerning his labs.   Returned the call to [place] and advised that not only was a voicemail left on her husbands mobile phone (number verified) but I'd also spoken to him directly and advised him of the lab results.   Then she stated that it was concerning his hip pain. I advised her that Dr. Karie Schwalbe was the Ortho Doc in this office taking care of her husbands Ortho needs.   Her response was that he was supposed to have imaging and hadn't heard the results. I advised her that the imaging (MR Arthrogram) had been ordered by Dr. Karie Schwalbe  (08/04/20) but had not been completed. I would research why this was not completed and contact her.   Research (via [facility]) shows the following:  6/3:**pt is NOT scheduled for mri on Monday, 6/6... if pt calls we need to set up injection for the next monday available. HS 6/2: LMOM telling pt to call back by 4 or we will schedule someone else in the slot. RG 6/1: LMOM, need to confirm appt for inj @ 4pm and schedule MRI. RG  [11:38 AM] Donne Anon that was on his appt desk from the girls in imaging  [11:38 AM] Donne Anon and he canceled his june 6th appt with T

## 2020-11-04 NOTE — Telephone Encounter (Signed)
Spoke with patient, he states that he pain is now intermittent and he would like to wait on doing any additional imaging for now.

## 2022-03-04 ENCOUNTER — Ambulatory Visit
Admission: EM | Admit: 2022-03-04 | Discharge: 2022-03-04 | Disposition: A | Discharge status: Home / Self Care | Payer: BC Managed Care – PPO | Attending: Internal Medicine | Admitting: Internal Medicine

## 2022-03-04 DIAGNOSIS — J201 Acute bronchitis due to Hemophilus influenzae: Secondary | ICD-10-CM

## 2022-03-04 MED ORDER — PROMETHAZINE-DM 6.25-15 MG/5ML PO SYRP: 5.0000 mL | ORAL_SOLUTION | Freq: Four times a day (QID) | ORAL | 0 refills | Status: AC | PRN

## 2022-03-04 MED ORDER — AZITHROMYCIN 250 MG PO TABS: ORAL_TABLET | ORAL | 0 refills | Status: AC

## 2022-03-04 MED ORDER — ALBUTEROL SULFATE HFA 108 (90 BASE) MCG/ACT IN AERS: 1.0000 | INHALATION_SPRAY | Freq: Four times a day (QID) | RESPIRATORY_TRACT | 0 refills | Status: AC | PRN

## 2022-03-04 MED ORDER — PREDNISONE 10 MG PO TABS: ORAL_TABLET | ORAL | 0 refills | Status: AC

## 2022-03-04 NOTE — ED Provider Notes (Signed)
[facility]    CSN: [id number] Arrival date & time: 03/04/22  1533      History   Chief Complaint Chief Complaint  Patient presents with   Nasal Congestion   Cough    HPI Stephanos Fan is a 41 y.o. male with a history of asthma, anxiety presents to UC today with complaint of left ear pain, nasal congestion only when he lays down, left-sided sore throat, cough and chest congestion.  He reports this started 1 week ago.  He is not blowing anything out of his nose.  He denies difficulty swallowing.  The cough is nonproductive.  He denies headache, runny nose, shortness of breath, chest pain, nausea, vomiting or diarrhea.  He has not run fever recently or had chills or body aches.  He has tried multiple OTC cold and cough medicines including Robitussin with minimal relief of symptoms.  He has had sick contacts with similar symptoms.  HPI  Past Medical History:  Diagnosis Date   Anxiety    Asthma    Childhood   Eczema    bilateral legs   Pneumonia 2006   Seasonal allergies     Patient Active Problem List   Diagnosis Date Noted   Well adult exam 08/16/2020   Right hip pain 07/28/2020    Past Surgical History:  Procedure Laterality Date   VASECTOMY     WISDOM TOOTH EXTRACTION         Home Medications    Prior to Admission medications   Medication Sig Start Date End Date Taking? Authorizing Provider  albuterol (VENTOLIN HFA) 108 (90 Base) MCG/ACT inhaler Inhale 1-2 puffs into the lungs every 6 (six) hours as needed for wheezing or shortness of breath. 03/04/22  Yes Lorre Munroe, NP  azithromycin (ZITHROMAX) 250 MG tablet Take 2 tabs today, then 1 tab daily x 4 days 03/04/22  Yes Romon Devereux, Salvadore Oxford, NP  predniSONE (DELTASONE) 10 MG tablet Take 6 tabs on day 1, 5 tabs on day 2, 4 tabs on day 3, 3 tabs on day 4, 2 tabs on day 5, 1 tab on day 6 03/04/22  Yes Claudean Leavelle, Salvadore Oxford, NP  promethazine-dextromethorphan (PROMETHAZINE-DM) 6.25-15 MG/5ML syrup Take 5 mLs  by mouth 4 (four) times daily as needed. 03/04/22  Yes Lorre Munroe, NP  meloxicam (MOBIC) 15 MG tablet Take 1 tablet (15 mg total) by mouth daily. 08/16/20   Everrett Coombe, DO  traMADol (ULTRAM) 50 MG tablet Take 1-2 tablets (50-100 mg total) by mouth every 8 (eight) hours as needed for moderate pain. Maximum 6 tabs per day. Patient not taking: Reported on 08/16/2020 08/04/20   Monica Becton, MD    Family History Family History  Problem Relation Age of Onset   Healthy Mother    Heart attack Father 48   Hypertension Maternal Grandfather    Hypertension Paternal Grandfather    Diabetes Paternal Grandfather     Social History Social History   Tobacco Use   Smoking status: Never   Smokeless tobacco: Never  Vaping Use   Vaping Use: Never used  Substance Use Topics   Alcohol use: Yes    Alcohol/week: 4.0 standard drinks of alcohol    Types: 4 Cans of beer per week    Comment: social   Drug use: No     Allergies   Patient has no known allergies.   Review of Systems Review of Systems  Constitutional:  Negative for chills and fever.  HENT:  Positive for congestion, ear pain, postnasal drip and sore throat. Negative for rhinorrhea, sinus pressure, sinus pain and trouble swallowing.   Eyes:  Negative for pain and redness.  Respiratory:  Positive for cough and chest tightness. Negative for shortness of breath.   Cardiovascular:  Negative for chest pain.  Gastrointestinal:  Negative for diarrhea, nausea and vomiting.  Musculoskeletal:  Positive for arthralgias. Negative for myalgias.  Skin:  Negative for rash.  Neurological:  Negative for dizziness, weakness, light-headedness and headaches.     Physical Exam Triage Vital Signs ED Triage Vitals  Enc Vitals Group     BP 03/04/22 1621 129/87     Pulse Rate 03/04/22 1621 76     Resp 03/04/22 1621 18     Temp 03/04/22 1621 98 F (36.7 C)     Temp Source 03/04/22 1621 Oral     SpO2 03/04/22 1621 98 %     Weight  03/04/22 1620 232 lb (105.2 kg)     Height 03/04/22 1620 5\' 11"  (1.803 m)     Head Circumference --      Peak Flow --      Pain Score 03/04/22 1620 4     Pain Loc --      Pain Edu? --      Excl. in GC? --    No data found.  Updated Vital Signs BP 129/87 (BP Location: Left Arm)   Pulse 76   Temp 98 F (36.7 C) (Oral)   Resp 18   Ht 5\' 11"  (1.803 m)   Wt 232 lb (105.2 kg)   SpO2 98%   BMI 32.36 kg/m    Physical Exam Constitutional:      Appearance: He is ill-appearing.  HENT:     Head: Normocephalic.     Comments: No sinus tenderness noted    Right Ear: Tympanic membrane, ear canal and external ear normal.     Left Ear: Tympanic membrane, ear canal and external ear normal.     Nose: Congestion present. No rhinorrhea.     Mouth/Throat:     Mouth: Mucous membranes are moist.     Pharynx: Oropharynx is clear. No oropharyngeal exudate or posterior oropharyngeal erythema.  Eyes:     Extraocular Movements: Extraocular movements intact.     Conjunctiva/sclera: Conjunctivae normal.     Pupils: Pupils are equal, round, and reactive to light.  Cardiovascular:     Rate and Rhythm: Normal rate and regular rhythm.  Pulmonary:     Effort: Pulmonary effort is normal.     Breath sounds: Wheezing and rhonchi present. No rales.  Lymphadenopathy:     Cervical: No cervical adenopathy.  Skin:    General: Skin is warm and dry.     Findings: No rash.  Neurological:     Mental Status: He is alert and oriented to person, place, and time.      UC Treatments / Results    Medications Ordered in UC Medications - No data to display  Initial Impression / Assessment and Plan / UC Course  I have reviewed the triage vital signs and the nursing notes.  Pertinent labs & imaging results that were available during my care of the patient were reviewed by me and considered in my medical decision making (see chart for details).     41 year old male with 1 week history of cough, chest  congestion after having presumed flu with persistent symptoms.  He is unable to get any mucus up.  Given  duration of symptoms, will treat for acute bronchitis secondary to influenza.  Rx for Azithromycin x 5 days, Prednisone taper x 6 days, Promethazine DM for cough and Albuterol inhaler.  Encouraged rest and fluids.  Advised to follow-up with his PCP if symptoms persist or worsen.  Final Clinical Impressions(s) / UC Diagnoses   Final diagnoses:  Acute bronchitis due to Haemophilus influenzae     Discharge Instructions      You were seen today for cough and congestion.  I think you have bronchitis likely secondary to the flu.  I am treating you with antibiotics, steroids, cough syrup and an albuterol inhaler.  We encourage rest and fluids.  Please follow-up with your PCP if symptoms persist or worsen.     ED Prescriptions     Medication Sig Dispense Auth. Provider   azithromycin (ZITHROMAX) 250 MG tablet Take 2 tabs today, then 1 tab daily x 4 days 6 tablet Bonham Zingale W, NP   predniSONE (DELTASONE) 10 MG tablet Take 6 tabs on day 1, 5 tabs on day 2, 4 tabs on day 3, 3 tabs on day 4, 2 tabs on day 5, 1 tab on day 6 21 tablet Lorre Munroe, NP   promethazine-dextromethorphan (PROMETHAZINE-DM) 6.25-15 MG/5ML syrup Take 5 mLs by mouth 4 (four) times daily as needed. 118 mL Lorre Munroe, NP   albuterol (VENTOLIN HFA) 108 (90 Base) MCG/ACT inhaler Inhale 1-2 puffs into the lungs every 6 (six) hours as needed for wheezing or shortness of breath. 8 g Lorre Munroe, NP      PDMP not reviewed this encounter.   Lorre Munroe, NP 03/04/22 [phone]

## 2022-03-04 NOTE — Discharge Instructions (Signed)
You were seen today for cough and congestion.  I think you have bronchitis likely secondary to the flu.  I am treating you with antibiotics, steroids, cough syrup and an albuterol inhaler.  We encourage rest and fluids.  Please follow-up with your PCP if symptoms persist or worsen.

## 2022-03-04 NOTE — ED Triage Notes (Signed)
Pt c/o 102.6 fever and cold symptoms starting 02/25/22.  Pt states that his symptoms started last Saturday and went away by Wednesday but have returned.   Pt states that the congestion has continued and pt is having it in the chest.   Pt is having wheezing and gurgling from the congestion. Pt states that he is having left ear pain.

## 2022-03-26 ENCOUNTER — Other Ambulatory Visit: Payer: Self-pay | Admitting: Internal Medicine

## 2022-03-27 NOTE — Telephone Encounter (Signed)
Unable to refill per protocol, appointment needed.  Will refuse.  Requested Prescriptions  Pending Prescriptions Disp Refills   albuterol (VENTOLIN HFA) 108 (90 Base) MCG/ACT inhaler [Pharmacy Med Name: ALBUTEROL HFA INH (200 PUFFS) 6.7GM] 6.7 g     Sig: INHALE 1 TO 2 PUFFS INTO THE LUNGS EVERY 6 HOURS AS NEEDED FOR WHEEZING OR SHORTNESS OF BREATH     Pulmonology:  [facility]onists 2 Failed - 03/26/2022  3:20 AM      Failed - Valid encounter within last 12 months    Recent Outpatient Visits           1 year ago Well adult exam   [facility], Einar Pheasant, DO   1 year ago Right hip pain    [facility], Gwen Her, MD   1 year ago Right hip pain    [facility], DO              Passed - Last BP in normal range    BP Readings from Last 1 Encounters:  03/04/22 129/87         Passed - Last Heart Rate in normal range    Pulse Readings from Last 1 Encounters:  03/04/22 76

## 2022-12-12 DIAGNOSIS — R5383 Other fatigue: Secondary | ICD-10-CM | POA: Diagnosis not present

## 2022-12-12 DIAGNOSIS — R635 Abnormal weight gain: Secondary | ICD-10-CM | POA: Diagnosis not present

## 2022-12-12 DIAGNOSIS — R7309 Other abnormal glucose: Secondary | ICD-10-CM | POA: Diagnosis not present

## 2022-12-12 DIAGNOSIS — R7989 Other specified abnormal findings of blood chemistry: Secondary | ICD-10-CM | POA: Diagnosis not present

## 2022-12-24 IMAGING — DX DG HIP (WITH OR WITHOUT PELVIS) 2-3V*R*
3 series · 3 of 3 positions shown · non-contrast
Comparison: None.

CLINICAL DATA: Right hip and groin pain radiating anteriorly for 2
weeks

EXAM:
DG HIP (WITH OR WITHOUT PELVIS) 2-3V RIGHT

[hip lat]
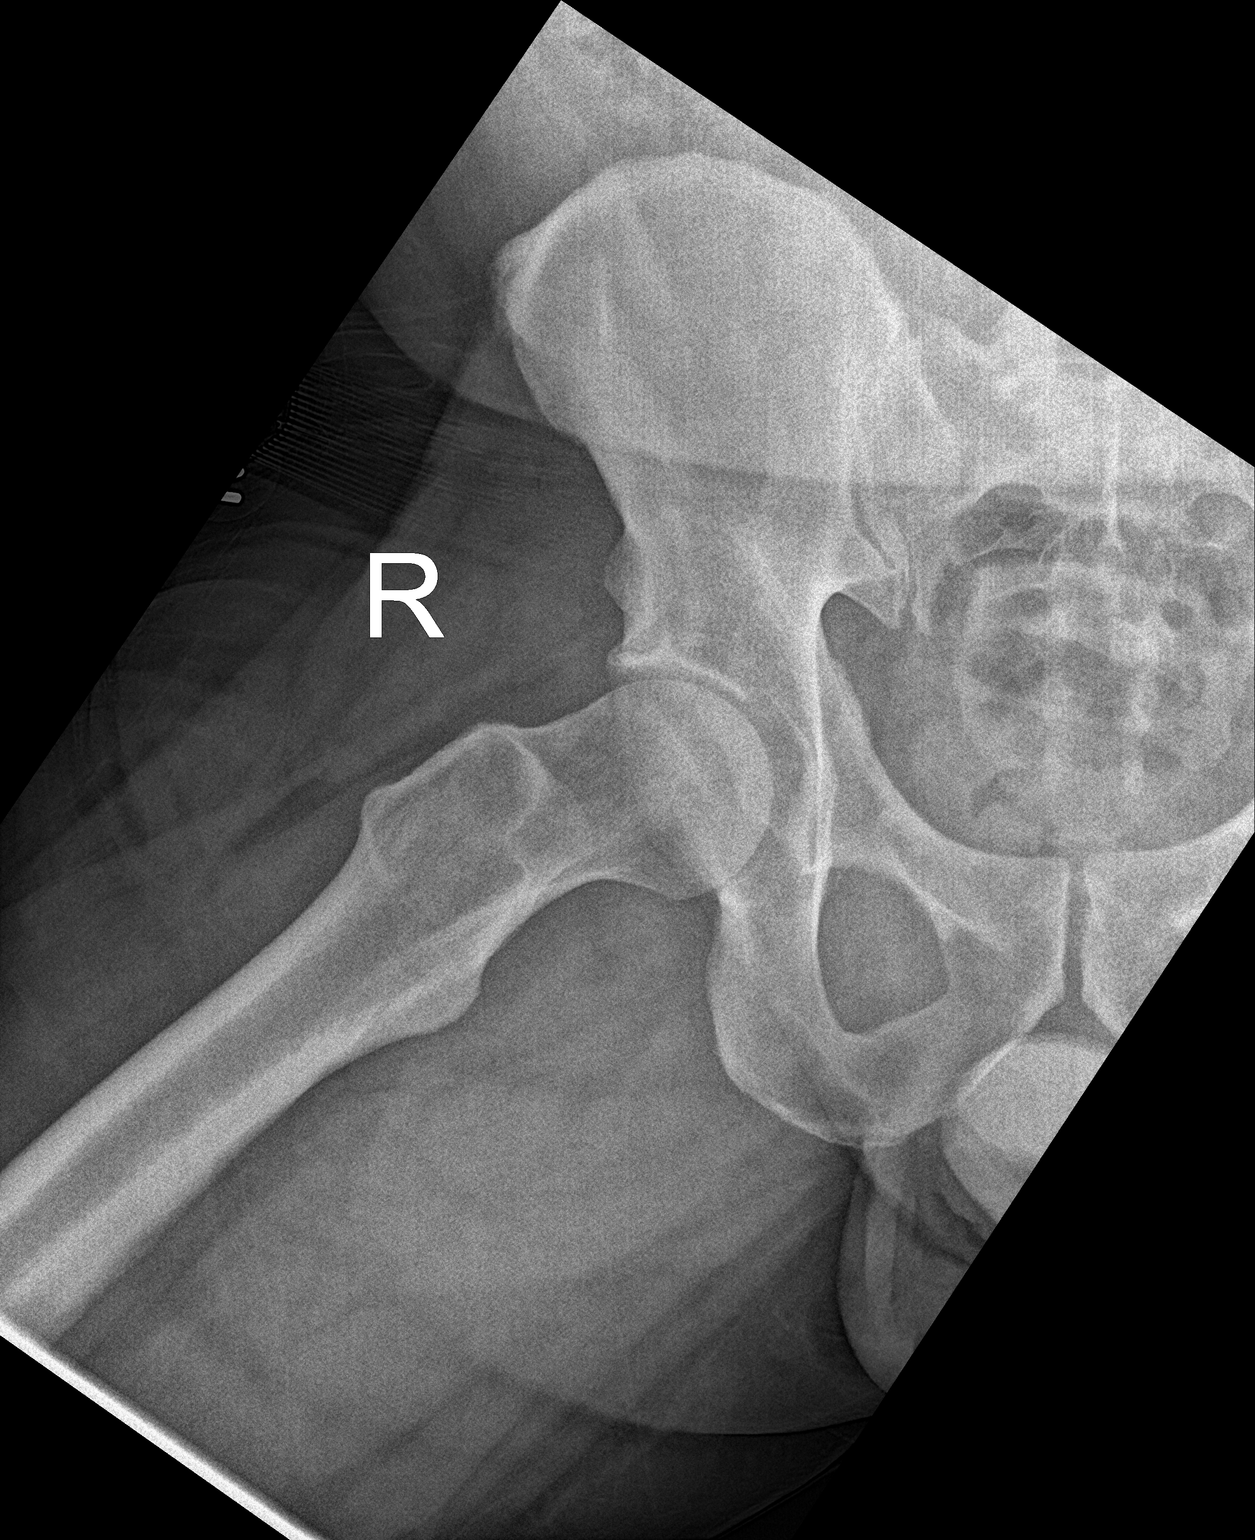

[hip ap]
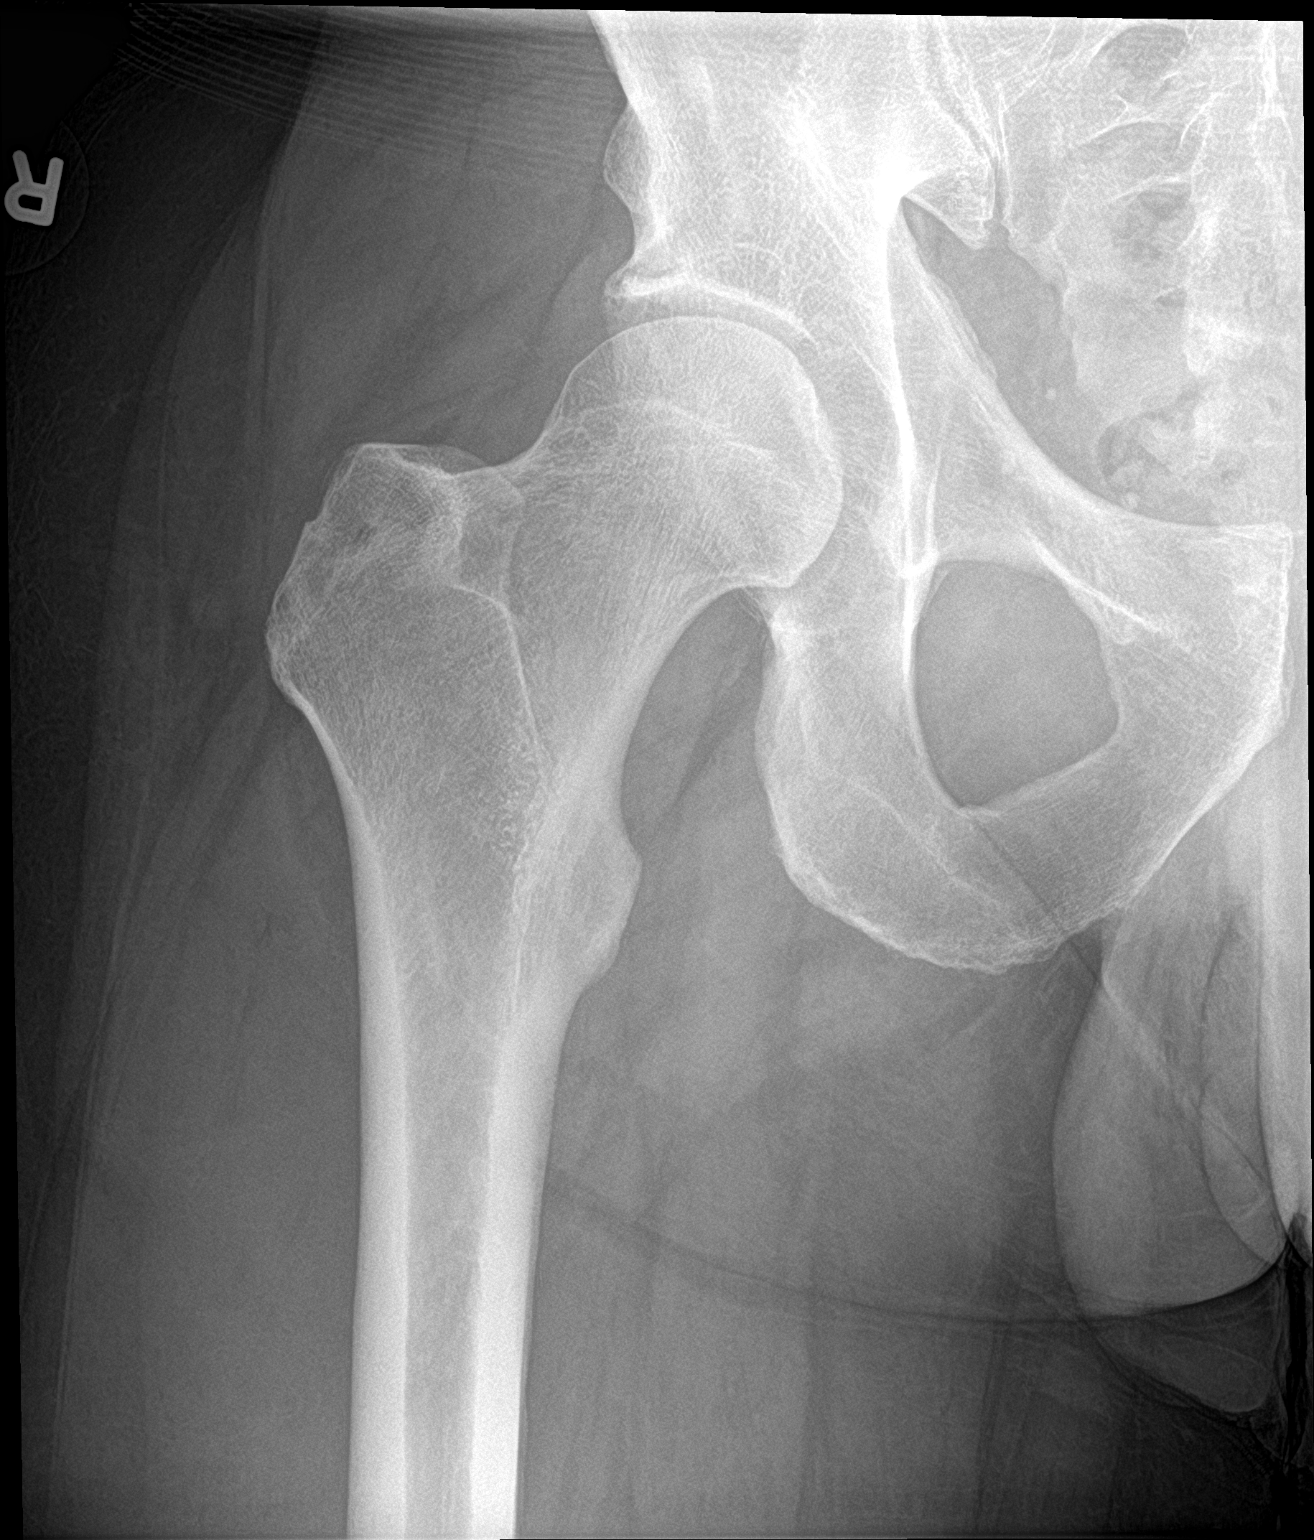

[pelvis ap]
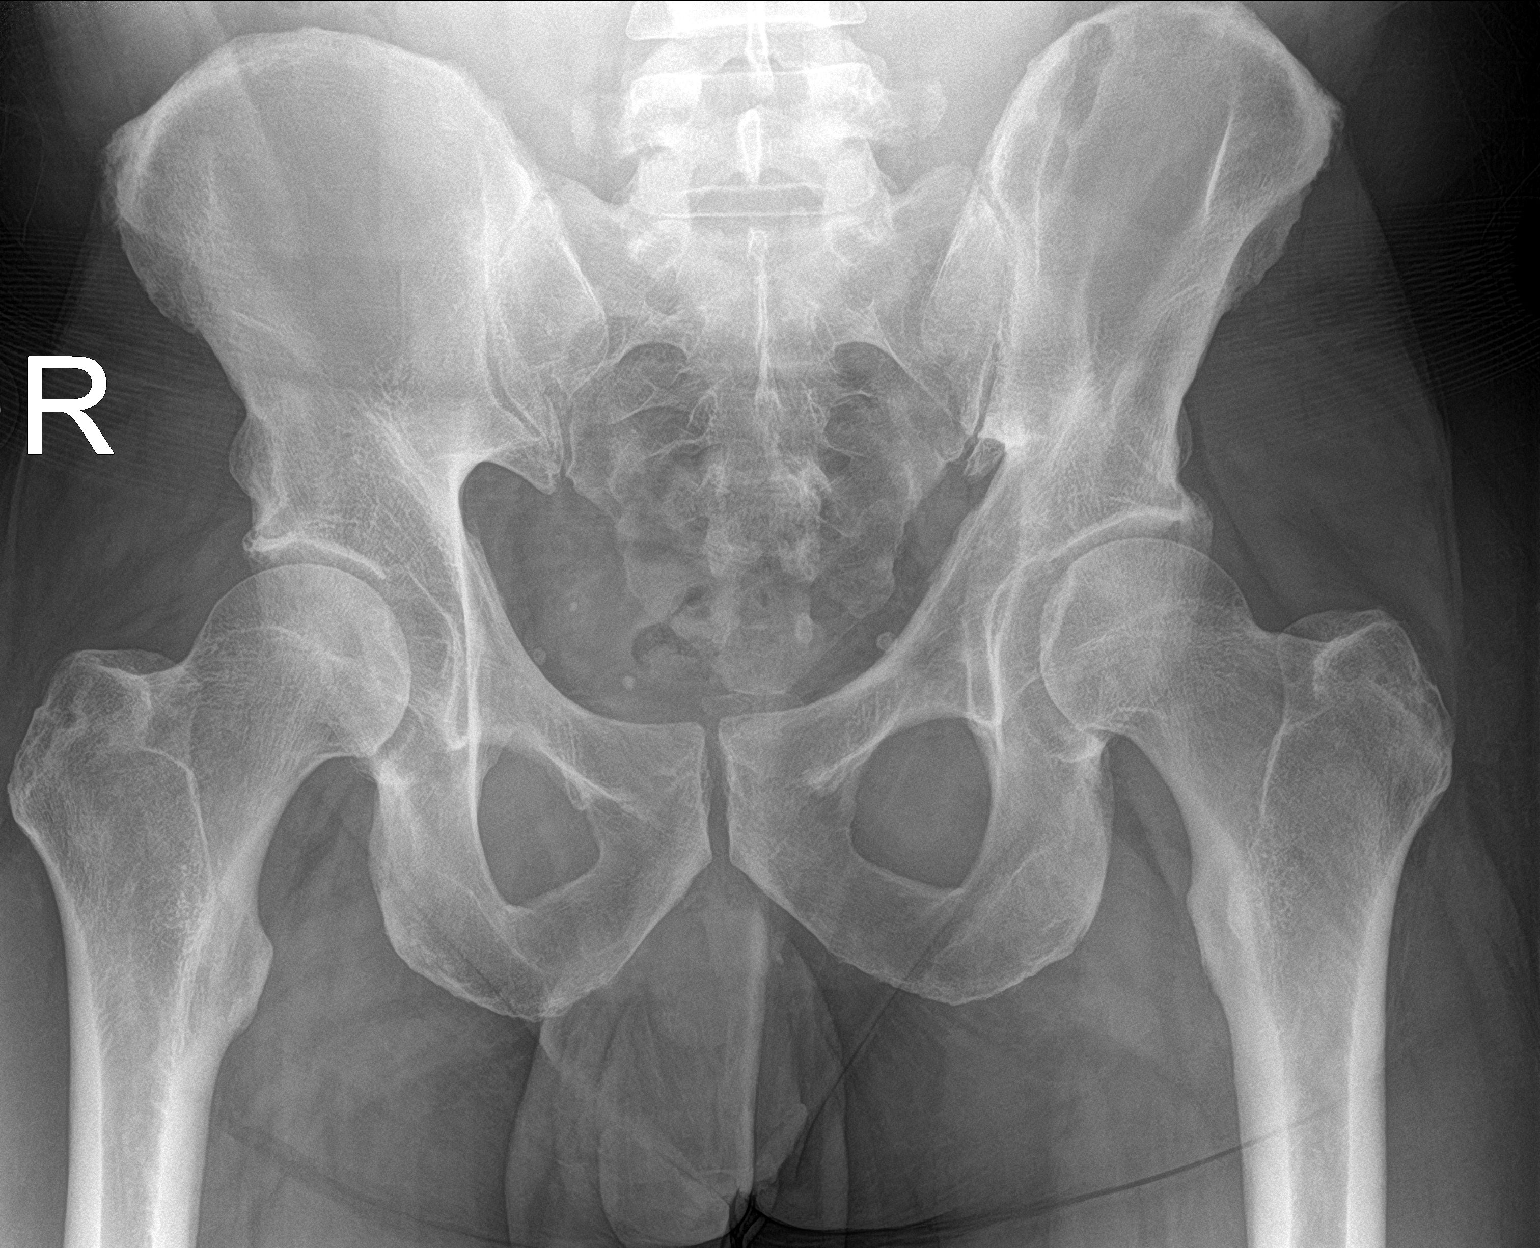

[3 of 3 positions shown; findings below may reference images not displayed]

FINDINGS: Frontal view of the pelvis as well as frontal and frogleg lateral
views of the right hip are obtained. No fracture, subluxation, or
dislocation. Joint spaces are well preserved. Sacroiliac joints are
normal.
IMPRESSION: 1. Unremarkable pelvis and right hip.
